# Patient Record
Sex: Female | Born: 1970 | Race: White | Hispanic: Yes | Marital: Single | Smoking: Never smoker
Health system: Southern US, Community
[De-identification: ages and names within clinical notes are randomized; demographics above are authoritative.]

## PROBLEM LIST (undated history)

## (undated) DIAGNOSIS — Z789 Other specified health status: Secondary | ICD-10-CM

## (undated) HISTORY — DX: Other specified health status: Z78.9

---

## 2015-02-21 HISTORY — PX: INGUINAL HERNIA REPAIR: SUR1180

## 2019-01-22 ENCOUNTER — Encounter: Payer: Self-pay | Admitting: Family Medicine

## 2019-01-22 ENCOUNTER — Ambulatory Visit (LOCAL_COMMUNITY_HEALTH_CENTER): Payer: Self-pay | Admitting: Family Medicine

## 2019-01-22 ENCOUNTER — Other Ambulatory Visit: Payer: Self-pay

## 2019-01-22 VITALS — BP 115/64 | Ht 60.0 in | Wt 126.0 lb

## 2019-01-22 DIAGNOSIS — Z3009 Encounter for other general counseling and advice on contraception: Secondary | ICD-10-CM

## 2019-01-22 DIAGNOSIS — Z30011 Encounter for initial prescription of contraceptive pills: Secondary | ICD-10-CM

## 2019-01-22 LAB — WET PREP FOR TRICH, YEAST, CLUE
Trichomonas Exam: NEGATIVE
Yeast Exam: NEGATIVE

## 2019-01-22 LAB — PREGNANCY, URINE: Preg Test, Ur: NEGATIVE

## 2019-01-22 MED ORDER — NORETHINDRONE 0.35 MG PO TABS: 1.0000 | ORAL_TABLET | Freq: Every day | ORAL | 0 refills | Status: DC

## 2019-01-22 NOTE — Progress Notes (Signed)
UPT is negative today. Wet mount reviewed with provider and no treatment needed per standing order and per [name] Done, FNP verbal order. Pt received Norethindrone (Micronor) #1 pack today per provider order. Counseled pt per provider orders and pt states understanding. IUD consult completed and pt scheduled for Paragard IUD insertion for 02/04/2019 at 10:00am per pt request. Pt aware of appt date and time and to arrive early for check-in, and appt card given to pt. Pt aware to either not have sex or use condoms every time from now until coming in for appt. Provider orders completed.Ronny Bacon, RN

## 2019-01-22 NOTE — Progress Notes (Addendum)
Family Planning Visit- Repeat Yearly Visit  Subjective:  Madison James is a 48 y.o. being seen today for an well woman visit and to discuss family planning options.    She is currently using none for pregnancy prevention. Patient reports she does not  or her partner wants a pregnancy in the next year. Patient  does not have a problem list on file.  Chief Complaint  Patient presents with  . Contraception    pap/std screen and discuss IUD    Patient reports she was seen in [place] 2 year ago for her pap (Normal per client) and exam.  Client has monthly menses and states that her LMP was 01/02/2019.  She has had unprotected sex x 3 since menses.  She denies chronic health concerns, she's non-smoker and denies STD sympts.  Does the patient desire a pregnancy in the next year? (OKQ flowsheet)no  See flowsheet for other program required questions.   Body mass index is 24.61 kg/m. - Patient is eligible for diabetes screening based on BMI and age >20?  not applicable HA1C ordered? not applicable  Patient reports 1 of partners in last year. Desires STI screening?  No  Does the patient have a current or past history of drug use? No   No components found for: HCV]   Health Maintenance Due  Topic Date Due  . HIV Screening  08/04/1984  . TETANUS/TDAP  08/04/1988  . PAP SMEAR-Modifier  08/05/1990  . INFLUENZA VACCINE  09/21/2018    ROS  The following portions of the patient's history were reviewed and updated as appropriate: allergies, current medications, past family history, past medical history, past social history, past surgical history and problem list. Problem list updated.  Objective:   Vitals:   01/22/19 1032  BP: 115/64  Weight: 126 lb (57.2 kg)  Height: 5' (1.524 m)    Physical Exam Constitutional:      Appearance: She is normal weight.  Chest:     Breasts: Breasts are symmetrical.        Right: Normal.        Left: Normal.  Abdominal:     General:  There is no distension.     Palpations: Abdomen is soft. There is no mass.     Tenderness: There is no abdominal tenderness.     Hernia: There is no hernia in the left inguinal area or right inguinal area.  Genitourinary:    General: Normal vulva.     Pubic Area: No rash or pubic lice.      Labia:        Right: No rash, tenderness or lesion.        Left: No rash, tenderness or lesion.      Urethra: No prolapse or urethral swelling.     Vagina: Normal. No vaginal discharge.     Cervix: Normal.     Uterus: Normal.      Adnexa: Right adnexa normal and left adnexa normal.     Rectum: Normal.  Musculoskeletal:     Cervical back: Neck supple. No tenderness.  Lymphadenopathy:     Cervical: No cervical adenopathy.     Upper Body:     Right upper body: No supraclavicular, axillary or pectoral adenopathy.     Left upper body: No supraclavicular, axillary or pectoral adenopathy.     Lower Body: No right inguinal adenopathy. No left inguinal adenopathy.  Skin:    General: Skin is warm and dry.  Findings: No lesion or rash.  Neurological:     Mental Status: She is alert.  Psychiatric:        Mood and Affect: Mood normal.       Assessment and Plan:  Madison James is a 48 y.o. female presenting to the [facility] Department for an initial well woman exam/family planning visit  Contraception counseling: Reviewed all forms of birth control options in the tiered based approach. available including abstinence; over the counter/barrier methods; hormonal contraceptive medication including pill, patch, ring, injection,contraceptive implant; hormonal and nonhormonal IUDs; permanent sterilization options including vasectomy and the various tubal sterilization modalities. Risks, benefits, and typical effectiveness rates were reviewed.  Questions were answered.  Written information was also given to the patient to review.  Patient desires IUD for birth control. Micronor x 1 pack  was prescribed for patient. She will follow up in  2 weeks for PT and IUD insertion.  She was told to call with any further questions, or with any concerns about this method of contraception.  Emphasized use of condoms 100% of the time for STI prevention.  ECP was not offered to the patient due to 3 times of unprotected sex since last menses (11/12//2020)- most recent 01/19/2019  Patient desires Paragard IUD.  She will start Micronor until IUD  this was prescribed for patient. She will follow up in  2 wks for surveillance.  Patient was counseled on the side effect of the method chosen, the correct use of her desired method and how to discontinue in the future. She was told to call with any further questions, or with any concerns about this method of contraception.  Emphasized use of condoms 100% of the time for back-up and STI prevention.  1. General counseling and advice on contraceptive management  - IGP, Aptima HPV - Chlamydia/Gonorrhea [facility] - HIV [place] [facility] - Syphilis Serology, [place] [facility] - WET PREP FOR TRICH, YEAST, clues/amine Treat wet prep as per SO. -PT if neg may begin POPs- PT negative Schedule for Paragard IUD insertion week of 02/03/2019.  Needs PT prior to insertion  2. OCP (oral contraceptive pills) initiation Micronor take 1 po daily x 1 pack. Co to use condoms for back-up x 2 weeks until has IUD inserted.   No follow-ups on file.  No future appointments.  [name] Done, FNP

## 2019-01-22 NOTE — Progress Notes (Signed)
Patient in clinic today for pap and to discuss IUD. Would like all std screening and blood work. Reports had IUD with no hormones in [place] and liked it. Paragard info given.Aileen Fass, RN

## 2019-01-28 LAB — IGP, APTIMA HPV
HPV Aptima: NEGATIVE
PAP Smear Comment: 0

## 2019-02-04 ENCOUNTER — Other Ambulatory Visit: Payer: Self-pay

## 2019-02-04 ENCOUNTER — Ambulatory Visit (LOCAL_COMMUNITY_HEALTH_CENTER): Payer: Self-pay | Admitting: Family Medicine

## 2019-02-04 VITALS — BP 109/71 | Ht 60.0 in | Wt 125.2 lb

## 2019-02-04 DIAGNOSIS — Z3009 Encounter for other general counseling and advice on contraception: Secondary | ICD-10-CM

## 2019-02-04 DIAGNOSIS — Z30430 Encounter for insertion of intrauterine contraceptive device: Secondary | ICD-10-CM

## 2019-02-04 DIAGNOSIS — Z3200 Encounter for pregnancy test, result unknown: Secondary | ICD-10-CM

## 2019-02-04 LAB — PREGNANCY, URINE: Preg Test, Ur: NEGATIVE

## 2019-02-04 MED ORDER — PARAGARD INTRAUTERINE COPPER IU IUD: INTRAUTERINE_SYSTEM | Freq: Once | INTRAUTERINE | Status: AC

## 2019-02-04 NOTE — Progress Notes (Signed)
    Patient presented to [facility] for IUD insertion. Her GC/CT screening from 01/23/2019 were not received and using WHO criteria we can be reasonably certain she is not pregnant or a pregnancy test was obtained which was Urine pregnancy test negative.  Client has been taking Micronor since 01/22/2019.  See Flowsheet for IUD check list  IUD Insertion Procedure Note Patient identified, informed consent performed, consent signed.   Discussed risks of irregular bleeding, cramping, infection, malpositioning or misplacement of the IUD outside the uterus which may require further procedure such as laparoscopy. Time out was performed.   Speculum placed in the vagina.  Cervix visualized.  Cleaned with Betadine x 2.  Grasped anteriorly with a single tooth tenaculum.  Uterus sounded to 8 cm.  IUD placed per manufacturer's recommendations.  Strings trimmed to 3 cm.  Tenaculum was removed, good hemostasis noted.  Patient tolerated procedure well.   Patient was given post-procedure instructions- both agency handout and verbally by provider.  She was advised to have backup contraception for one week.  Patient was also asked to check IUD strings periodically or follow up in 4 weeks for IUD check.

## 2019-02-04 NOTE — Progress Notes (Signed)
No physical at [facility]. Negative UPT. Pt to clinic for Paragard IUD insertion.

## 2019-08-05 ENCOUNTER — Encounter: Payer: Self-pay | Admitting: Physician Assistant

## 2019-08-05 NOTE — Progress Notes (Signed)
Patient had cotest pap in 01/2019, and was reviewed by me.  Results note not showing in chart, but patient to have repeat pap in 5 years (cotest), 01/2024.

## 2019-12-15 ENCOUNTER — Ambulatory Visit (LOCAL_COMMUNITY_HEALTH_CENTER): Payer: Self-pay | Admitting: Advanced Practice Midwife

## 2019-12-15 ENCOUNTER — Other Ambulatory Visit: Payer: Self-pay

## 2019-12-15 ENCOUNTER — Ambulatory Visit: Payer: Self-pay

## 2019-12-15 VITALS — BP 111/69 | Temp 99.0°F | Ht 60.0 in | Wt 125.4 lb

## 2019-12-15 DIAGNOSIS — Z3009 Encounter for other general counseling and advice on contraception: Secondary | ICD-10-CM

## 2019-12-15 NOTE — Progress Notes (Signed)
Presents to [facility] for evaluation of back pain and headache. Adult Health Services info sheet given. Counseled physical is due 01/2020. Denies problems with IUD. Jossie Ng, RN

## 2019-12-15 NOTE — Progress Notes (Signed)
   [facility] problem [facility]  [facility]  Subjective:  Madison James is a 49 y.o. SHF G4P3 (30, 28, 15) nonsmoker being seen today for c/o left shoulder blade/back pain x 3 wks and constant H/A "always" on left side.  Was lifting heavy boxes at work and thinks that what caused the pain initially so had to quit working several months ago.  Last Tylenol yesterday.  Upon further questioning, her 70 yo son died 2019-08-23 of OD and she just got his belongings a few days ago.  Pt grieving and crying and declines counseling x2.  PE due 01/2020.  Happy with IUD inserted 02/04/19.Marland Kitchen  Last pap 01/2019.  Can't remember last sex.  LMP 11/04/19.  Chief Complaint  Patient presents with  . back pain, HA    HPI   Does the patient have a current or past history of drug use? No   No components found for: HCV]   Health Maintenance Due  Topic Date Due  . Hepatitis C Screening  Never done  . HIV Screening  Never done  . TETANUS/TDAP  Never done  . MAMMOGRAM  Never done  . COLONOSCOPY  Never done  . INFLUENZA VACCINE  Never done    ROS  The following portions of the patient's history were reviewed and updated as appropriate: allergies, current medications, past family history, past medical history, past social history, past surgical history and problem list. Problem list updated.   See flowsheet for other program required questions.  Objective:   Vitals:   12/15/19 1335  BP: 111/69  Temp: 99 F (37.2 C)  TempSrc: Oral  Weight: 125 lb 6.4 oz (56.9 kg)  Height: 5' (1.524 m)    Physical Exam Vitals and nursing note reviewed.  Constitutional:      Appearance: Normal appearance. She is normal weight.  HENT:     Head: Normocephalic.  Eyes:     Conjunctiva/sclera: Conjunctivae normal.  Pulmonary:     Effort: Pulmonary effort is normal.  Musculoskeletal:        General: Normal range of motion.  Skin:    General: Skin is warm and dry.   Neurological:     Mental Status: She is alert. Mental status is at baseline.   palpation of back reveals tight muscles in left side of neck, shoulder, back tender to palpation    Assessment and Plan:  Madison James is a 49 y.o. female presenting to the [facility] Department for a Women's Health problem visit  1. Family planning Please give primary care MD list to pt Contact info given for Kathreen Cosier, LCSW Pt counseled to make apt with a primary care MD; suggestions given for warm moist heat to area, massage, Crista Elliot or [employer]     No follow-ups on file.  No future appointments.  Alberteen Spindle, CNM

## 2020-03-19 ENCOUNTER — Other Ambulatory Visit: Payer: Self-pay

## 2020-08-03 ENCOUNTER — Other Ambulatory Visit: Payer: Self-pay

## 2020-08-03 ENCOUNTER — Ambulatory Visit (LOCAL_COMMUNITY_HEALTH_CENTER): Payer: Self-pay | Admitting: Family Medicine

## 2020-08-03 ENCOUNTER — Encounter: Payer: Self-pay | Admitting: Family Medicine

## 2020-08-03 VITALS — BP 109/69 | HR 78 | Temp 98.6°F | Resp 18 | Ht 60.0 in | Wt 130.2 lb

## 2020-08-03 DIAGNOSIS — Z3009 Encounter for other general counseling and advice on contraception: Secondary | ICD-10-CM

## 2020-08-03 DIAGNOSIS — E010 Iodine-deficiency related diffuse (endemic) goiter: Secondary | ICD-10-CM

## 2020-08-03 DIAGNOSIS — R1032 Left lower quadrant pain: Secondary | ICD-10-CM

## 2020-08-03 DIAGNOSIS — Z113 Encounter for screening for infections with a predominantly sexual mode of transmission: Secondary | ICD-10-CM

## 2020-08-03 DIAGNOSIS — R1031 Right lower quadrant pain: Secondary | ICD-10-CM

## 2020-08-03 DIAGNOSIS — Z0000 Encounter for general adult medical examination without abnormal findings: Secondary | ICD-10-CM

## 2020-08-03 DIAGNOSIS — N644 Mastodynia: Secondary | ICD-10-CM

## 2020-08-03 LAB — WET PREP FOR TRICH, YEAST, CLUE
Trichomonas Exam: NEGATIVE
Yeast Exam: NEGATIVE

## 2020-08-03 LAB — PREGNANCY, URINE: Preg Test, Ur: NEGATIVE

## 2020-08-03 NOTE — Progress Notes (Signed)
Family Planning Visit- Repeat Yearly Visit  Subjective:  Madison James is a 50 y.o. [phone]  being seen today for an annual wellness visit and to discuss contraception options.   The patient is currently using IUD Paragard for pregnancy prevention. Patient does not want a pregnancy in the next year. Patient has the following medical problems: does not have a problem list on file.  Chief Complaint  Patient presents with   Annual Exam    Patient reports here for physical  Patient denies need for birth control   See flowsheet for other program required questions.   Body mass index is 25.43 kg/m. - Patient is eligible for diabetes screening based on BMI and age >79?  no HA1C ordered? no  Patient reports 1 of partners in last year. Desires STI screening?  Yes   Has patient been screened once for HCV in the past?  No  No results found for: HCVAB  Does the patient have current of drug use, have a partner with drug use, and/or has been incarcerated since last result? No  If yes-- Screen for HCV through [facility]   Does the patient meet criteria for HBV testing? No  Criteria:  -Household, sexual or needle sharing contact with HBV -History of drug use -HIV positive -Those with known Hep C   Health Maintenance Due  Topic Date Due   HIV Screening  Never done   Hepatitis C Screening  Never done   TETANUS/TDAP  Never done   COLONOSCOPY (Pts 45-19yrs Insurance coverage will need to be confirmed)  Never done   MAMMOGRAM  Never done   Zoster Vaccines- Shingrix (1 of 2) Never done    Review of Systems  Constitutional:  Negative for chills, fever, malaise/fatigue and weight loss.  HENT:  Negative for congestion, hearing loss and sore throat.   Eyes:  Negative for blurred vision, double vision and photophobia.  Respiratory:  Negative for shortness of breath.   Cardiovascular:  Negative for chest pain.  Gastrointestinal:  Negative for abdominal pain, blood in stool,  constipation, diarrhea, heartburn, nausea and vomiting.  Genitourinary:  Negative for dysuria and frequency.  Musculoskeletal:  Negative for back pain, joint pain and neck pain.  Skin:  Negative for itching and rash.  Neurological:  Positive for headaches. Negative for dizziness and weakness.  Endo/Heme/Allergies:  Does not bruise/bleed easily.  Psychiatric/Behavioral:  Negative for depression, substance abuse and suicidal ideas.    The following portions of the patient's history were reviewed and updated as appropriate: allergies, current medications, past family history, past medical history, past social history, past surgical history and problem list. Problem list updated.  Objective:   Vitals:   08/03/20 0938  BP: 109/69  Pulse: 78  Resp: 18  Temp: 98.6 F (37 C)  TempSrc: Oral  Weight: 130 lb 3.2 oz (59.1 kg)  Height: 5' (1.524 m)    Physical Exam Vitals and nursing note reviewed. Exam conducted with a chaperone present.  Constitutional:      Appearance: Normal appearance.  HENT:     Head: Normocephalic and atraumatic.     Mouth/Throat:     Mouth: Mucous membranes are moist.     Dentition: Normal dentition. No dental caries.     Pharynx: No oropharyngeal exudate or posterior oropharyngeal erythema.  Eyes:     General: No scleral icterus. Neck:     Thyroid: Thyromegaly present.  Cardiovascular:     Rate and Rhythm: Normal rate.  Pulses: Normal pulses.     Heart sounds: Normal heart sounds.  Pulmonary:     Effort: Pulmonary effort is normal.     Breath sounds: Normal breath sounds.  Chest:  Breasts:    Right: Mass and tenderness present. No swelling, bleeding, inverted nipple, nipple discharge or skin change.     Left: Mass and tenderness present. No swelling, bleeding, inverted nipple, nipple discharge or skin change.    Abdominal:     General: Abdomen is flat. Bowel sounds are normal.     Palpations: There is mass.     Tenderness: There is abdominal  tenderness in the left lower quadrant.  Genitourinary:    General: Normal vulva.     Rectum: Normal.     Comments: External genitalia without, lice, nits, erythema, edema , lesions or inguinal adenopathy. Vagina with normal mucosa and white discharge and pH equals 4.  Cervix without visual lesions, uterus firm, mobile, non-tender, no masses, CMT adnexal fullness or tenderness. IUD strings visualized  Musculoskeletal:        General: Normal range of motion.     Cervical back: Normal range of motion and neck supple.  Lymphadenopathy:     Cervical: No cervical adenopathy.  Skin:    General: Skin is warm and dry.  Neurological:     General: No focal deficit present.     Mental Status: She is alert and oriented to person, place, and time.  Psychiatric:        Mood and Affect: Mood normal.        Behavior: Behavior normal.      Assessment and Plan:  Madison James is a 50 y.o. female [phone] presenting to the [facility] Department for an yearly wellness and contraception visit   1. Routine general medical examination at a health care facility Well woman visit today - discussed perimenopause   Last pap was 02/09/2019- not due today Clinic Breast Exam   2. Family planning Contraception counseling: Reviewed all forms of birth control options in the tiered based approach. available including abstinence; over the counter/barrier methods; hormonal contraceptive medication including pill, patch, ring, injection,contraceptive implant, ECP; hormonal and nonhormonal IUDs; permanent sterilization options including vasectomy and the various tubal sterilization modalities. Risks, benefits, and typical effectiveness rates were reviewed.  Questions were answered.  Written information was also given to the patient to review.  Patient desires to keep Paraguard. She will follow up as needed for surveillance.  She was told to call with any further questions, or with any concerns about this  method of contraception.  Emphasized use of condoms 100% of the time for STI prevention.  Patient was not offered ECP based on current BCM.  - Pregnancy, urine   3. Screening examination for venereal disease Denies s/sx  - Chlamydia/Gonorrhea [place] [facility] - HIV [place] [facility] - Syphilis Serology, [facility] - WET PREP FOR TRICH, YEAST, CLUE  4. Bilateral lower abdominal pain Report x 2 weeks- reports that poop is hard small and pebble like  Discussed constipation and methods to resolve and prevent constipation., such as increasing water, prunes/ juice, miralax.   Information sheet given,  [facility] if not resolved  5. Pain of both breasts Reports sore breast for past 2 weeks Mass in both breast - R  mass is 3x1 cm @ 9 o'clock  L- @ 2 o'clock and is in most of outer quad.   Patient  groaned in pain with palpation.   Referral sent for BCCCP for follow up and mammogram   6. Thyromegaly Ref[facility] for patient follow up and labs.      Juliene Pina used for Spanish interpretation.     No follow-ups on file.  No future appointments.  Wendi Snipes, FNP

## 2020-08-03 NOTE — Progress Notes (Signed)
UPT = negative. Reviewed.  Wet prep = WNL. Reviewed Reviewed with provider, M.Garner, FNP, during clinic visit.   Floy Sabina, RN

## 2020-08-04 ENCOUNTER — Telehealth: Payer: Self-pay | Admitting: Gerontology

## 2020-08-04 NOTE — Telephone Encounter (Signed)
Pt was provided with information regarding the clinic and how to become a pt. They have stated they will try and come by the clinic this afternoon. If they do not come by today, they will try and be in the clinic next week.

## 2020-08-25 ENCOUNTER — Ambulatory Visit: Payer: Self-pay

## 2020-09-01 ENCOUNTER — Other Ambulatory Visit: Payer: Self-pay

## 2020-09-01 ENCOUNTER — Ambulatory Visit: Payer: Self-pay | Attending: Oncology

## 2020-09-01 ENCOUNTER — Ambulatory Visit
Admission: RE | Admit: 2020-09-01 | Discharge: 2020-09-01 | Disposition: A | Discharge status: Home / Self Care | Payer: Self-pay | Source: Ambulatory Visit | Attending: Oncology | Admitting: Oncology

## 2020-09-01 VITALS — BP 117/64 | HR 80 | Temp 97.4°F | Ht 60.0 in | Wt 128.7 lb

## 2020-09-01 DIAGNOSIS — N63 Unspecified lump in unspecified breast: Secondary | ICD-10-CM

## 2020-09-01 NOTE — Progress Notes (Signed)
  Subjective:     Patient ID: Madison James, female   DOB: [birth year], 50 y.o.   MRN: [id number]  HPI  BCCCP Medical History Record - 09/01/20 1256       Breast History   Screening cycle New    [facility] Date 08/04/20    [facility]    Initial Mammogram 09/01/20    Last Mammogram See comment   2014 in [place]   Recent Breast Symptoms Lump      Breast Cancer History   Breast Cancer History No personal or family history      Previous History of Breast Problems   Breast Surgery or Biopsy None    Breast Implants N/A    BSE Done Never      Gynecological/Obstetrical History   LMP 08/19/20    Is there any chance that the client could be pregnant?  No    Age at menarche 22    Age at menopause NA    PAP smear history Annually    Date of last PAP  01/22/19    Provider (PAP) Dr. Kizzie Ide    Age at first live birth 51    Breast fed children Yes (type length in comments)   a couple of months   DES Exposure Unkown    Cervical, Uterine or Ovarian cancer No    Family history of Cervial, Uterine or Ovarian cancer No    Hysterectomy No    Cervix removed No    Ovaries removed No    Laser/Cryosurgery No    Current method of birth control --   IUD   Current method of Estrogen/Hormone replacement None    Smoking history None              Review of Systems     Objective:   Physical Exam     Assessment:     50 year old Hispanic patient referred for bilateral breast masses.  Primary provider palpated a 3 cm breast mass right breast 9 o'clock and left breast pain and mass at 2 o'clock.  Patient screened, and meets BCCCP eligibility.  Patient does not have insurance, Medicare or Medicaid.  Instructed patient on breast self awareness using teach back method. Patient denies any breast pain or mass at today's visit.   Palpated a nodule left breast 9 oclock 2 cm from areola.  [name] from [facility] interpreted exam. Risk Assessment     Risk Scores       09/01/2020   Last  edited by: [name], RN   5-year risk: 0.5 %   Lifetime risk: 4.5 %               Plan:     Sent for bilateral diagnostic mammogram, and ultrasound.

## 2020-09-02 ENCOUNTER — Other Ambulatory Visit: Payer: Self-pay

## 2020-09-02 DIAGNOSIS — N63 Unspecified lump in unspecified breast: Secondary | ICD-10-CM

## 2020-09-02 NOTE — Progress Notes (Signed)
Radiologist reviewed Birads 3 mammogram results with patient.  Scheduled to return in six months for follow-up right breast mammogram.  Mailed appointment information to patient.

## 2021-03-03 ENCOUNTER — Other Ambulatory Visit: Payer: Self-pay

## 2021-03-07 ENCOUNTER — Ambulatory Visit
Admission: RE | Admit: 2021-03-07 | Discharge: 2021-03-07 | Disposition: A | Discharge status: Home / Self Care | Payer: Self-pay | Source: Ambulatory Visit | Attending: Oncology | Admitting: Oncology

## 2021-03-07 ENCOUNTER — Other Ambulatory Visit: Payer: Self-pay

## 2021-03-07 DIAGNOSIS — N63 Unspecified lump in unspecified breast: Secondary | ICD-10-CM | POA: Insufficient documentation

## 2021-03-16 ENCOUNTER — Encounter: Payer: Self-pay | Admitting: *Deleted

## 2021-03-16 NOTE — Progress Notes (Signed)
Letter mailed to inform patient of the need to return in six months for her next mammogram and annual BCCCP appointment.  Number given to schedule BCCCP appointment if she is still uninsured at that time.  Will transition care to Fonnie Mu, RN with BCCCP.

## 2021-09-20 ENCOUNTER — Other Ambulatory Visit: Payer: Self-pay

## 2021-09-20 DIAGNOSIS — N6489 Other specified disorders of breast: Secondary | ICD-10-CM

## 2021-09-21 ENCOUNTER — Ambulatory Visit: Payer: Self-pay

## 2021-09-23 ENCOUNTER — Other Ambulatory Visit: Payer: Self-pay

## 2021-09-23 ENCOUNTER — Ambulatory Visit: Payer: Self-pay | Attending: Hematology and Oncology | Admitting: *Deleted

## 2021-09-23 VITALS — BP 133/75 | Wt 133.7 lb

## 2021-09-23 DIAGNOSIS — N644 Mastodynia: Secondary | ICD-10-CM

## 2021-09-23 DIAGNOSIS — Z1211 Encounter for screening for malignant neoplasm of colon: Secondary | ICD-10-CM

## 2021-09-23 DIAGNOSIS — Z1239 Encounter for other screening for malignant neoplasm of breast: Secondary | ICD-10-CM

## 2021-09-23 NOTE — Patient Instructions (Signed)
Explained breast self awareness with Renee Harder Turcios. Patient did not need a Pap smear today due to last Pap smear and HPV typing was 01/22/2019. Let her know BCCCP will cover Pap smears and HPV typing every 5 years unless has a history of abnormal Pap smears. Referred patient to the [facility] for a diagnostic mammogram per recommendation. Appointment scheduled Wednesday, September 28, 2021 at 1400. Patient aware of appointment and will be there. CRISTEL RAIL Turcios verbalized understanding.  Jersi Mcmaster, Kathaleen Maser, RN 9:55 AM

## 2021-09-23 NOTE — Progress Notes (Signed)
[name] is a 51 y.o. female who presents to [facility] clinic today with complaint of left lower breast pain since July 2022 that comes and goes. Patient rates the pain at at 4 out of 10. Patient had a right breast diagnostic mammogram completed 03/07/2021 that a bilateral diagnostic mammogram is recommended for follow up.   Pap Smear: Pap smear not completed today. Last Pap smear was 01/22/2019 at the [facility] clinic and was normal with negative HPV. Per patient has no history of an abnormal Pap smear. Last Pap smear result is available in Epic.   Physical exam: Breasts Breasts symmetrical. No skin abnormalities bilateral breasts. No nipple retraction bilateral breasts. No nipple discharge bilateral breasts. No lymphadenopathy. No lumps palpated bilateral breasts. Complaints of left lower breast tenderness on exam.      MS DIGITAL DIAG TOMO UNI RIGHT  Result Date: 03/07/2021 CLINICAL DATA:  Six-month follow-up of a probable benign asymmetry in the posterolateral aspect of the right breast with no sonographic correlate. EXAM: DIGITAL DIAGNOSTIC UNILATERAL RIGHT MAMMOGRAM WITH TOMOSYNTHESIS AND CAD TECHNIQUE: Right digital diagnostic mammography and breast tomosynthesis was performed. The images were evaluated with computer-aided detection. COMPARISON:  Previous exam(s). ACR Breast Density Category c: The breast tissue is heterogeneously dense, which may obscure small masses. FINDINGS: Asymmetry in the posterolateral aspect of the right breast only seen on the cc view is stable. No suspicious mass or malignant type microcalcifications identified. IMPRESSION: Stable probable benign asymmetry in the right breast. RECOMMENDATION: Bilateral diagnostic mammogram in July of 2023 is recommended. I have discussed the findings and recommendations with the patient. If applicable, a reminder letter will be sent to the patient regarding the next appointment. BI-RADS CATEGORY  3:  Probably benign. Electronically Signed   By: Baird Lyons M.D.   On: 03/07/2021 10:42  MS DIGITAL DIAG TOMO BILAT  Result Date: 09/01/2020 CLINICAL DATA:  51 year old female who originally presented to a physician in June for evaluation of bilateral tender palpable lumps. The doctor at that time noted a tender mass at 9 o'clock measuring 3 x 1 cm and another mass had 2 o'clock involving much of the upper-outer quadrant of the left breast which was firm, hot and painful to the touch. The patient was referred to [facility], and now describes a palpable left breast mass at 9 o'clock, 2 cm from the areola. EXAM: DIGITAL DIAGNOSTIC BILATERAL MAMMOGRAM WITH TOMOSYNTHESIS AND CAD; ULTRASOUND LEFT BREAST LIMITED; ULTRASOUND RIGHT BREAST LIMITED TECHNIQUE: Bilateral digital diagnostic mammography and breast tomosynthesis was performed. The images were evaluated with computer-aided detection.; Targeted ultrasound examination of the left breast was performed; Targeted ultrasound examination of the right breast was performed COMPARISON:  None. ACR Breast Density Category c: The breast tissue is heterogeneously dense, which may obscure small masses. FINDINGS: There is an asymmetry in the lateral right breast seen only on the CC view which spans approximately 2.8 cm. A BB has been placed on the lower inner quadrant of the left breast at the palpable site of concern. No suspicious findings are identified deep to this palpable marker on the spot compression tomosynthesis images. No suspicious calcifications, masses or areas of distortion are seen in the bilateral breasts. Physical exam of the lateral right breast, lateral left breast and lower inner left breast demonstrates no discrete suspicious palpable masses. Ultrasound targeted to the lateral aspect of the right breast demonstrates normal dense fibroglandular tissue. No suspicious masses or areas of shadowing are identified. Ultrasound targeted to  the palpable area in the  upper-outer quadrant of the left breast demonstrates normal fibroglandular tissue. No suspicious masses or areas of shadowing are identified. Ultrasound targeted to the patient's identified palpable site in the lower-inner quadrant of the left breast demonstrates normal fibroglandular tissue. No suspicious masses or areas of shadowing are identified. IMPRESSION: 1. There is a likely benign asymmetry in the posterolateral right breast on the CC view. 2. There are no suspicious mammographic or targeted sonographic abnormalities to correspond with the palpable areas of concern in the bilateral breasts. 3.  No mammographic evidence of malignancy in the bilateral breasts. RECOMMENDATION: Six-month follow-up diagnostic right breast mammogram. I have discussed the findings and recommendations with the patient. If applicable, a reminder letter will be sent to the patient regarding the next appointment. BI-RADS CATEGORY  3: Probably benign. Electronically Signed   By: Frederico Hamman M.D.   On: 09/01/2020 17:11   Pelvic/Bimanual Pap is not indicated today per BCCCP guidelines.   Smoking History: Patient has never smoked.   Patient Navigation: Patient education provided. Access to services provided for patient through [employer] program. Spanish interpreter [name] from [facility] provided.   Colorectal Cancer Screening: Per patient has never had colonoscopy completed. FIT Test given to patient to complete. No complaints today.    Breast and Cervical Cancer Risk Assessment: Patient does not have family history of breast cancer, known genetic mutations, or radiation treatment to the chest before age 91. Patient does not have history of cervical dysplasia, immunocompromised, or DES exposure in-utero.  Risk Assessment     Risk Scores       09/23/2021 09/01/2020   Last edited by: Narda Rutherford, LPN [name], RN   5-year risk: 0.5 % 0.5 %   Lifetime risk: 4.4 % 4.5 %             A: BCCCP exam without pap smear Complaint of right breast pain.  P: Referred patient to the [facility] for a diagnostic mammogram per recommendation. Appointment scheduled Wednesday, September 28, 2021 at 1400.  Priscille Heidelberg, RN 09/23/2021 9:55 AM

## 2021-09-28 ENCOUNTER — Ambulatory Visit
Admission: RE | Admit: 2021-09-28 | Discharge: 2021-09-28 | Disposition: A | Discharge status: Home / Self Care | Payer: Self-pay | Source: Ambulatory Visit | Attending: Obstetrics and Gynecology | Admitting: Obstetrics and Gynecology

## 2021-09-28 DIAGNOSIS — N6489 Other specified disorders of breast: Secondary | ICD-10-CM

## 2021-10-26 LAB — FECAL OCCULT BLOOD, IMMUNOCHEMICAL

## 2021-11-02 ENCOUNTER — Telehealth: Payer: Self-pay

## 2021-11-02 NOTE — Telephone Encounter (Signed)
10/31/2021- Via Madison James, [facility]), Patient informed due to delay transport, specimen for FIT test could not be processed, we can send another FIT test kit if prefers. Patient requested new kit to be mailed to her. FIT test kit mailed.

## 2021-11-03 ENCOUNTER — Ambulatory Visit (LOCAL_COMMUNITY_HEALTH_CENTER): Payer: Self-pay | Admitting: Advanced Practice Midwife

## 2021-11-03 ENCOUNTER — Ambulatory Visit: Payer: Self-pay

## 2021-11-03 ENCOUNTER — Encounter: Payer: Self-pay | Admitting: Advanced Practice Midwife

## 2021-11-03 VITALS — BP 114/71 | Ht 60.0 in | Wt 131.6 lb

## 2021-11-03 DIAGNOSIS — Z3009 Encounter for other general counseling and advice on contraception: Secondary | ICD-10-CM

## 2021-11-03 DIAGNOSIS — Z30431 Encounter for routine checking of intrauterine contraceptive device: Secondary | ICD-10-CM

## 2021-11-03 NOTE — Progress Notes (Signed)
[facility] [address] Main Number: [phone]    Family Planning Visit- Initial Visit  Subjective:  Madison James is a 51 y.o. SHF nonsmoker  [id number] (32, 30, 16)  being seen today for an initial annual visit and to discuss reproductive life planning.  The patient is currently using IUD or IUS for pregnancy prevention. Patient reports   does not want a pregnancy in the next year.     report they are looking for a method that provides High efficacy at preventing pregnancy  Patient has the following medical conditions does not have a problem list on file.  Chief Complaint  Patient presents with   Annual Exam    PE and IUD check    Patient reports here for physical and IUD check. Last PE 07/2020. Paraguard inserted 02/04/2019. Last pap 01/22/2019 neg HPV neg. LMP 08/13/21.  Last sex 10/31/21 without condom; with current partner x 3 years; 1 partner in last 3 mo. Last dental exam never. First mammogram 10/01/21. Not working. Living with her 82 yo daughter. C/o "my bones hurt all the time" x 6 mo relieved with Tylenol.   Patient denies cigs, vaping, cigars, MJ  Body mass index is 25.7 kg/m. - Patient is eligible for diabetes screening based on BMI and age >60?  yes HA1C ordered? yes  Patient reports 1  partner/s in last year. Desires STI screening?  Yes  Has patient been screened once for HCV in the past?  No  No results found for: "HCVAB"  Does the patient have current drug use (including MJ), have a partner with drug use, and/or has been incarcerated since last result? No  If yes-- Screen for HCV through [facility]   Does the patient meet criteria for HBV testing? No  Criteria:  -Household, sexual or needle sharing contact with HBV -History of drug use -HIV positive -Those with known Hep C   Health Maintenance Due  Topic Date Due   COVID-19 Vaccine (1) Never done   HIV Screening  Never done    Hepatitis C Screening  Never done   TETANUS/TDAP  Never done   COLONOSCOPY (Pts 45-28yrs Insurance coverage will need to be confirmed)  Never done   Zoster Vaccines- Shingrix (1 of 2) Never done   INFLUENZA VACCINE  Never done    Review of Systems  Eyes:  Positive for blurred vision (has never been to eye doctor; encouraged eye exam).  Genitourinary:  Positive for frequency (drinks 1 c. coffee/day, tea, occassional soda; counseled to stop, x 4 mo with symptoms. referred to primary care MD).  Neurological:  Positive for headaches (q 3 days always occipital relieved with Tylenol).  All other systems reviewed and are negative.   The following portions of the patient's history were reviewed and updated as appropriate: allergies, current medications, past family history, past medical history, past social history, past surgical history and problem list. Problem list updated.   See flowsheet for other program required questions.  Objective:   Vitals:   11/03/21 1542  BP: 114/71  Weight: 131 lb 9.6 oz (59.7 kg)  Height: 5' (1.524 m)    Physical Exam Constitutional:      Appearance: Normal appearance. She is normal weight.  HENT:     Head: Normocephalic and atraumatic.     Mouth/Throat:     Mouth: Mucous membranes are moist.     Comments: Dentition fair; never had dental exam Eyes:  Conjunctiva/sclera: Conjunctivae normal.  Neck:     Thyroid: No thyroid mass, thyromegaly or thyroid tenderness.  Cardiovascular:     Rate and Rhythm: Normal rate and regular rhythm.  Pulmonary:     Effort: Pulmonary effort is normal.     Breath sounds: Normal breath sounds.  Chest:  Breasts:    Right: Normal.     Left: Normal.  Abdominal:     Palpations: Abdomen is soft.     Comments: Soft without masses or tenderness, fgood tone  Genitourinary:    General: Normal vulva.     Exam position: Lithotomy position.     Vagina: Vaginal discharge (white creamy leukorrhea, ph<4.5) present.      Cervix: Normal.     Uterus: Normal.      Adnexa: Right adnexa normal and left adnexa normal.     Rectum: Normal.     Comments: Paraguard strings visible Musculoskeletal:        General: Normal range of motion.     Cervical back: Normal range of motion and neck supple.  Skin:    General: Skin is warm and dry.  Neurological:     Mental Status: She is alert.  Psychiatric:        Mood and Affect: Mood normal.       Assessment and Plan:  Madison James is a 51 y.o. female presenting to the [facility] for an initial annual wellness/contraceptive visit  Contraception counseling: Reviewed options based on patient desire and reproductive life plan. Patient is interested in IUD or IUS. This was not provided to the patient today. Already has Paraguard in place. if not why not clearly documented  Risks, benefits, and typical effectiveness rates were reviewed.  Questions were answered.  Written information was also given to the patient to review.    The patient will follow up in  1 years for surveillance.  The patient was told to call with any further questions, or with any concerns about this method of contraception.  Emphasized use of condoms 100% of the time for STI prevention.  Need for ECP was assessed. Patient reported not meeting criteria.  Reviewed options and patient desired Cu-IUD   1. Family planning Treat wet mount per standing orders Please give pt dental list Pe[place] give pt primary care MD list Immunization nurse consult   - WET PREP FOR TRICH, YEAST, CLUE - Hemoglobin, venipuncture - Chlamydia/Gonorrhea [facility] - Hgb A1c w/o eAG - HIV [place] [facility] - Syphilis Serology, [place] [facility]  2. Encounter for routine checking of intrauterine contraceptive device (IUD) IUD strings visible     No follow-ups on file.  No future appointments.  Alberteen Spindle, CNM

## 2021-11-03 NOTE — Progress Notes (Signed)
Pt here for PE and IUD check.  Wet mount results reviewed, no treatment required per Provider.  Pt notified of normal Hgb results.  Berdie Ogren, RN

## 2021-11-04 LAB — WET PREP FOR TRICH, YEAST, CLUE
Trichomonas Exam: NEGATIVE
Yeast Exam: NEGATIVE

## 2021-11-04 LAB — HGB A1C W/O EAG: Hgb A1c MFr Bld: 5.8 % — ABNORMAL HIGH (ref 4.8–5.6)

## 2021-11-04 LAB — HEMOGLOBIN, FINGERSTICK: Hemoglobin: 14 g/dL (ref 11.1–15.9)

## 2021-11-14 ENCOUNTER — Telehealth: Payer: Self-pay

## 2021-11-14 NOTE — Telephone Encounter (Signed)
-----   Message from Eduard Clos, RN sent at 11/04/2021 10:05 AM EDT -----  ----- Message ----- From: Interface, [facility] Lab Results In Sent: 11/04/2021   5:36 AM EDT To: Achd-Results

## 2021-11-14 NOTE — Telephone Encounter (Signed)
Pt notified of Hgb A1c results.  Pt states that she does not have a PCP, however, she will make an appointment with someone from the PCP list that was given to her in our office.

## 2021-12-15 ENCOUNTER — Other Ambulatory Visit: Payer: Self-pay

## 2021-12-15 DIAGNOSIS — Z1211 Encounter for screening for malignant neoplasm of colon: Secondary | ICD-10-CM

## 2022-06-29 ENCOUNTER — Ambulatory Visit: Payer: Self-pay | Admitting: Family Medicine

## 2022-06-29 ENCOUNTER — Encounter: Payer: Self-pay | Admitting: Family Medicine

## 2022-06-29 VITALS — BP 123/75 | HR 81 | Ht 60.0 in | Wt 130.6 lb

## 2022-06-29 DIAGNOSIS — N760 Acute vaginitis: Secondary | ICD-10-CM

## 2022-06-29 DIAGNOSIS — Z113 Encounter for screening for infections with a predominantly sexual mode of transmission: Secondary | ICD-10-CM

## 2022-06-29 DIAGNOSIS — B9689 Other specified bacterial agents as the cause of diseases classified elsewhere: Secondary | ICD-10-CM

## 2022-06-29 LAB — WET PREP FOR TRICH, YEAST, CLUE
Trichomonas Exam: NEGATIVE
Yeast Exam: NEGATIVE

## 2022-06-29 MED ORDER — METRONIDAZOLE 500 MG PO TABS: 500.0000 mg | ORAL_TABLET | Freq: Two times a day (BID) | ORAL | 0 refills | Status: AC

## 2022-06-29 NOTE — Progress Notes (Signed)
[facility] Problem Visit  [facility]  Subjective:  Annaliz Savary is a 52 y.o. being seen today for   Chief Complaint  Patient presents with   Acute Visit    Here for IUD removal-    HPI  Pt reports no period the last 3 months, she states that 6 months ago, she began having intermittent discharge and a vaginal odor that she didn't like. She attributes this to the IUD. I reviewed with her that this was unlikely, because she has had the IUD for 4 years, and I expressed that it is possible her body is going through hormonal changes and likely perimenopausal. I explained that we can remove the IUD if she wants, however if she continues to have periods- though unlikely, she could still possibly get pregnant.    Health Maintenance Due  Topic Date Due   COVID-19 Vaccine (1) Never done   HIV Screening  Never done   Hepatitis C Screening  Never done   DTaP/Tdap/Td (1 - Tdap) Never done   COLONOSCOPY (Pts 45-62yrs Insurance coverage will need to be confirmed)  Never done   Zoster Vaccines- Shingrix (1 of 2) Never done    Review of Systems  Constitutional:  Negative for weight loss.  Eyes:  Negative for blurred vision.  Respiratory:  Negative for cough and shortness of breath.   Cardiovascular:  Negative for claudication.  Gastrointestinal:  Negative for nausea.  Genitourinary:  Negative for dysuria and frequency.  Skin:  Negative for rash.  Neurological:  Negative for headaches.  Endo/Heme/Allergies:  Does not bruise/bleed easily.    The following portions of the patient's history were reviewed and updated as appropriate: allergies, current medications, past family history, past medical history, past social history, past surgical history and problem list. Problem list updated.   See flowsheet for other program required questions.  Objective:   Vitals:   06/29/22 0935  BP: 123/75  Pulse: 81  Weight: 130 lb 9.6 oz (59.2 kg)   Height: 5' (1.524 m)    Physical Exam Vitals and nursing note reviewed.  Constitutional:      Appearance: Normal appearance.  HENT:     Head: Normocephalic and atraumatic.     Mouth/Throat:     Mouth: Mucous membranes are moist.     Pharynx: Oropharynx is clear. No oropharyngeal exudate or posterior oropharyngeal erythema.  Pulmonary:     Effort: Pulmonary effort is normal.  Abdominal:     General: Abdomen is flat.     Palpations: There is no mass.     Tenderness: There is no abdominal tenderness. There is no rebound.  Genitourinary:    General: Normal vulva.     Exam position: Lithotomy position.     Pubic Area: No rash or pubic lice.      Tanner stage (genital): 5.     Labia:        Right: No rash or lesion.        Left: No rash or lesion.      Vagina: Normal. No vaginal discharge, erythema, bleeding or lesions.     Cervix: Lesion present. No cervical motion tenderness, discharge, friability or erythema.     Uterus: Normal.      Adnexa: Right adnexa normal and left adnexa normal.     Rectum: Normal.        Comments: pH = 5  Redness on cervix- possible eversion -white paraguard strings visualized  Mod amt of  thin yellow discharge visualized  Lymphadenopathy:     Head:     Right side of head: No preauricular or posterior auricular adenopathy.     Left side of head: No preauricular or posterior auricular adenopathy.     Cervical: No cervical adenopathy.     Upper Body:     Right upper body: No supraclavicular, axillary or epitrochlear adenopathy.     Left upper body: No supraclavicular, axillary or epitrochlear adenopathy.     Lower Body: No right inguinal adenopathy. No left inguinal adenopathy.  Skin:    General: Skin is warm and dry.     Findings: No rash.  Neurological:     Mental Status: She is alert and oriented to person, place, and time.       Assessment and Plan:  Wretha Lieb is a 52 y.o. female presenting to the [facility]  [facility] for a Women's Health problem visit  1. Screening for venereal disease Initially presented for IUD removal d/t 6 months of intermittent discharge from vagina -Wet mount today showed BV- treated today Pt decided to keep IUD  - Chlamydia/Gonorrhea [place] [facility] - WET PREP FOR TRICH, YEAST, CLUE  2. Bacterial vaginosis  - metroNIDAZOLE (FLAGYL) 500 MG tablet; Take 1 tablet (500 mg total) by mouth 2 (two) times daily for 7 days.  Dispense: 14 tablet; Refill: 0    Return in about 1 year (around 06/29/2023) for annual well-woman exam.  No future appointments.  Due to language barrier, interpreter [employer] [phone]) was present for this visit.  Lenice Llamas, [place]

## 2022-06-29 NOTE — Progress Notes (Signed)
Pt here for removal of IUD.  No issues with IUD other than concern it's causing current discharge/vaginal odor.  Wet mount done.  Results positive for bacterial vaginosis.  Metronidazole 500mg  #14 1 po BID x 7 days dispensed to patient.  Counseled on medication, side effects, plan of care and when to contact clinic for questions or concerns.  Pt verbalizes understanding of above.  Per provider consult, pt advised that her IUD was not the cause of her current bacterial infection but she would remove the IUD if patient desired.  Pt to continue with IUD for now.  BV pamphlet in Spanish given to pt.  Condoms declined.-Collins Scotland, RN

## 2022-07-02 IMAGING — MG MM DIGITAL DIAGNOSTIC UNILAT*R* W/ TOMO W/ CAD
4 series · 4 of 12 positions shown · non-contrast
Comparison: Previous exam(s).

CLINICAL DATA: Six-month follow-up of a probable benign asymmetry
in the posterolateral aspect of the right breast with no sonographic
correlate.

EXAM:
DIGITAL DIAGNOSTIC UNILATERAL RIGHT MAMMOGRAM WITH TOMOSYNTHESIS AND
CAD
TECHNIQUE: Right digital diagnostic mammography and breast tomosynthesis was
performed. The images were evaluated with computer-aided detection.

[R CC synth-2D]
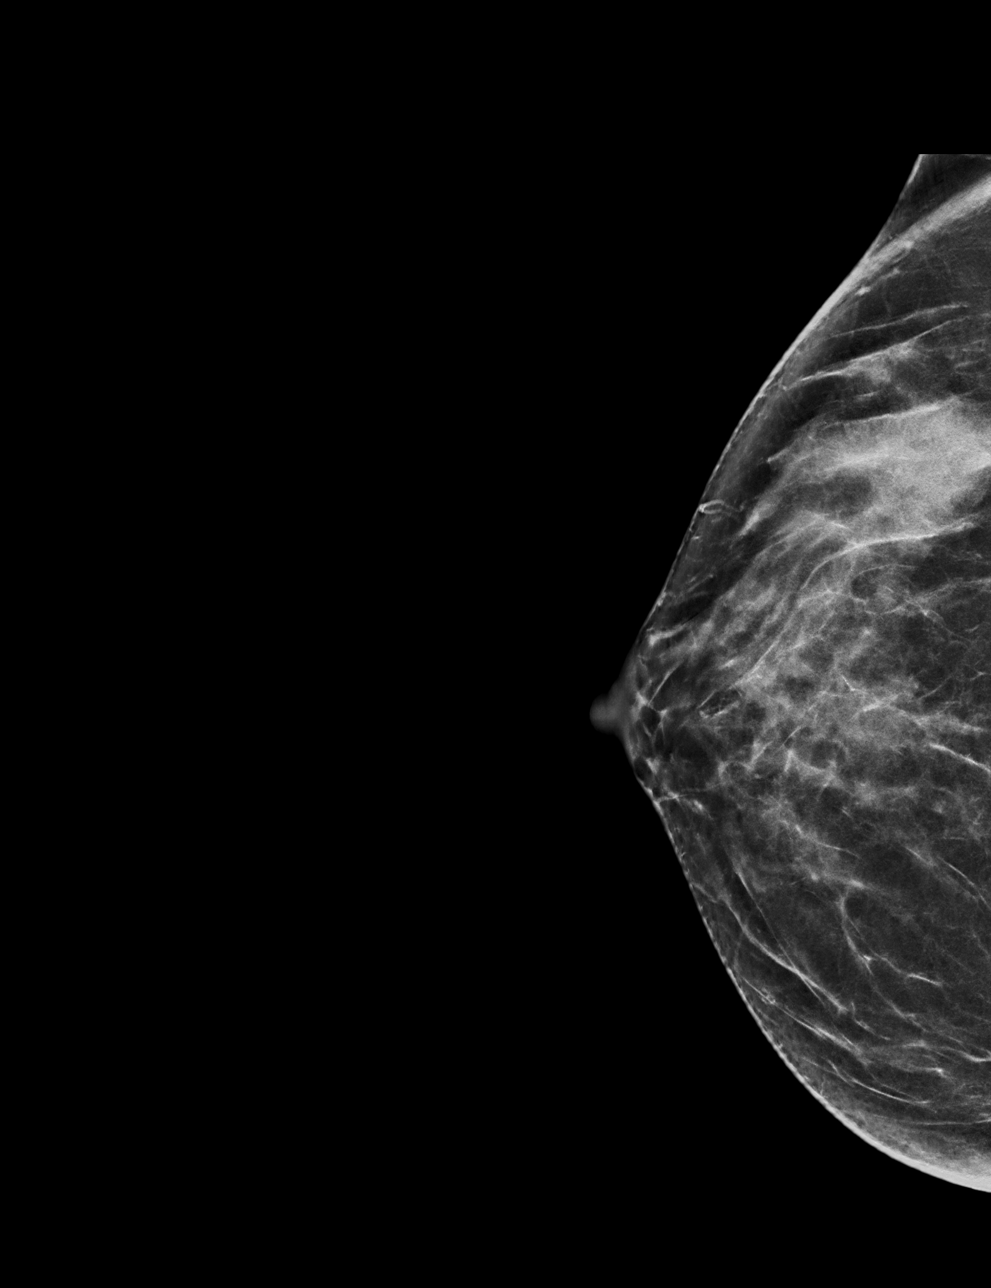

[R MLO synth-2D]
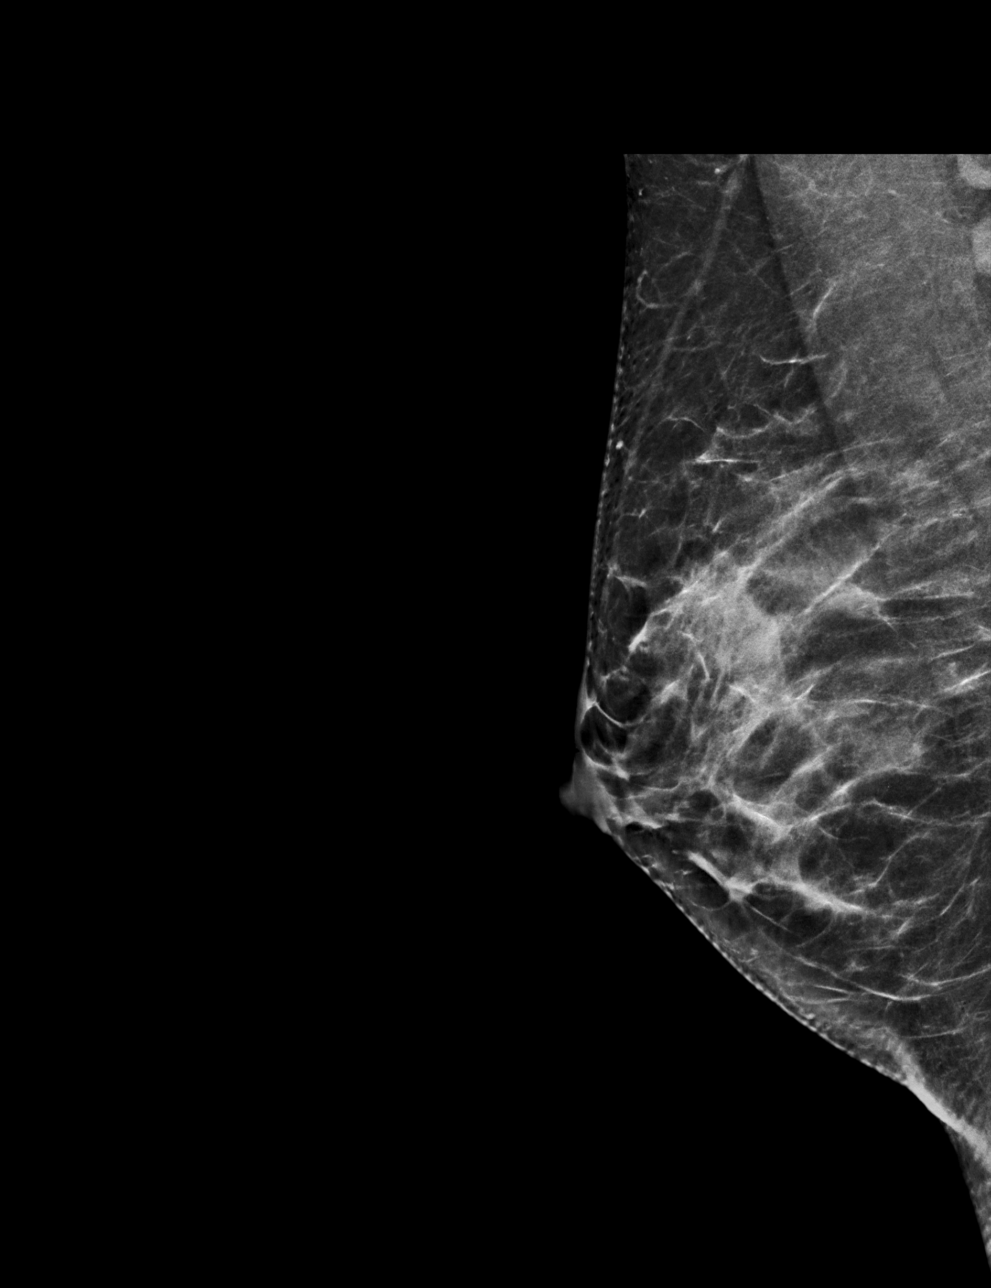

[R MLO tomo · tomo slice 33/65.0]
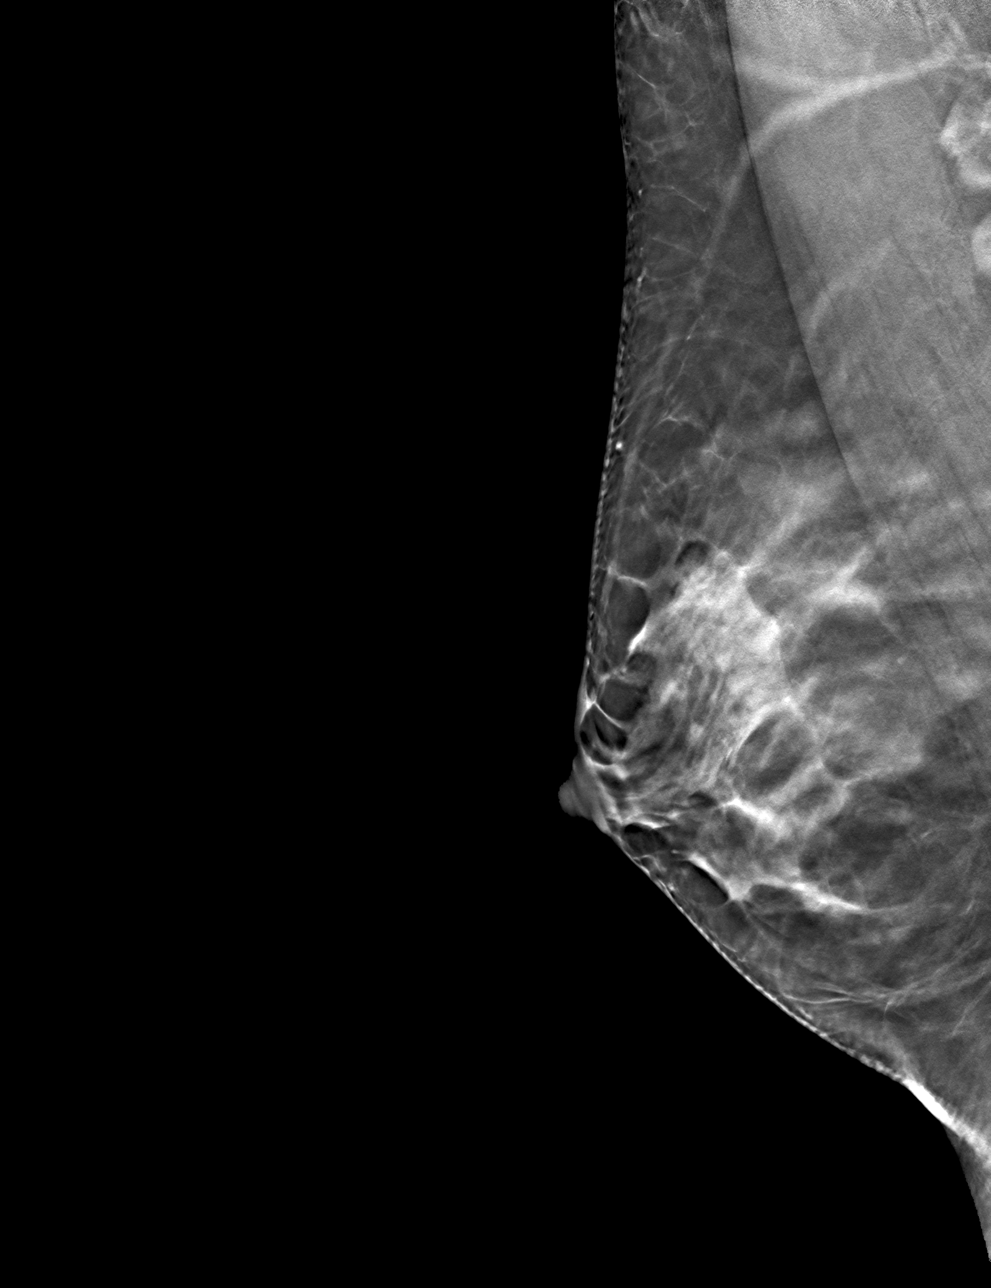

[R CC tomo · tomo slice 30/59.0]
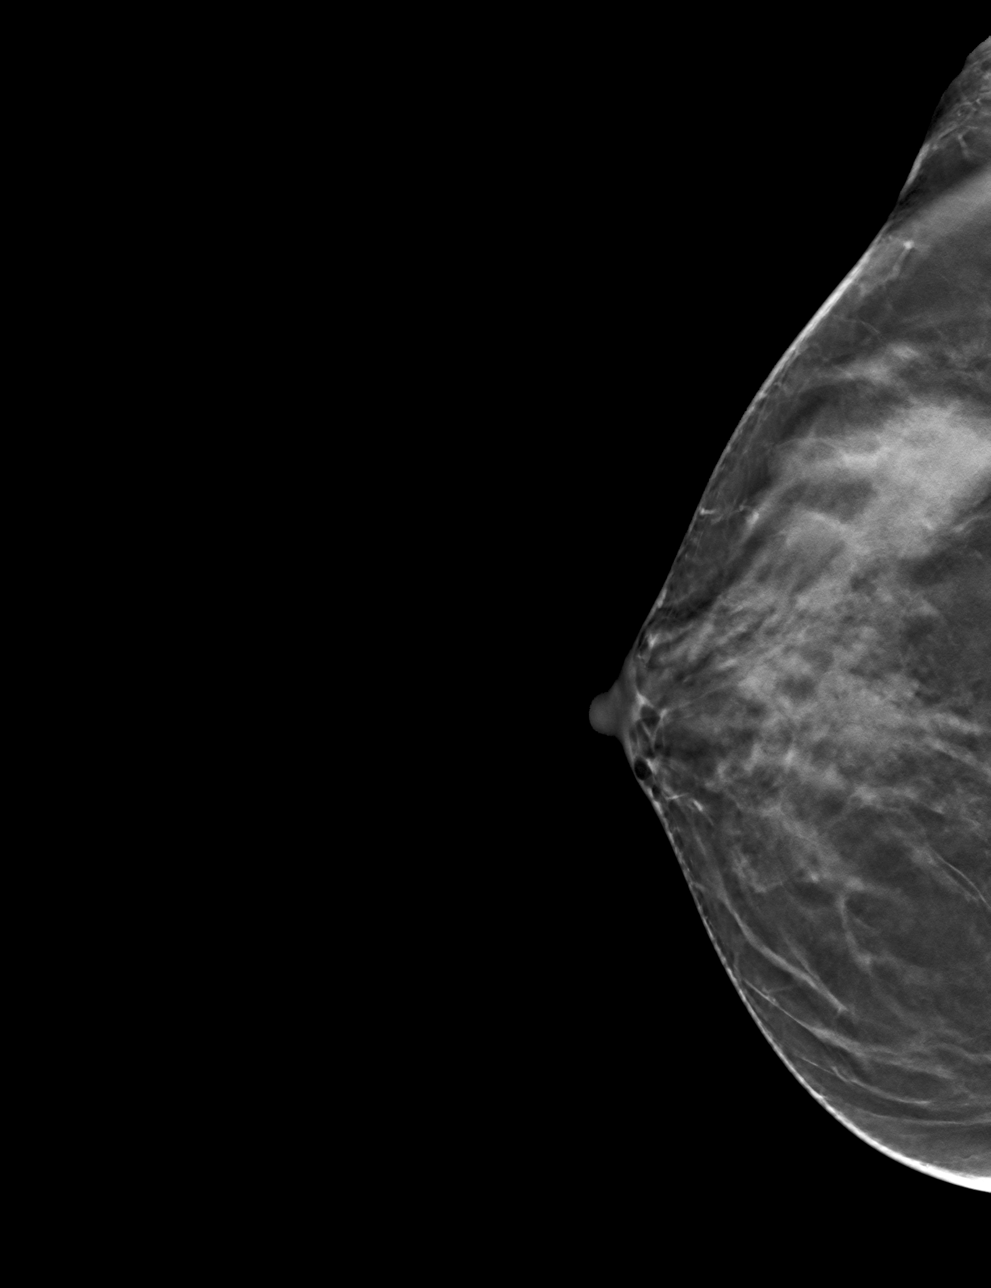

[4 of 12 positions shown; findings below may reference images not displayed]

ACR Breast Density Category c: The breast tissue is heterogeneously
dense, which may obscure small masses.
FINDINGS: Asymmetry in the posterolateral aspect of the right breast only seen
on the cc view is stable. No suspicious mass or malignant type
microcalcifications identified.
IMPRESSION: Stable probable benign asymmetry in the right breast.

RECOMMENDATION:
Bilateral diagnostic mammogram in Friday August, 2021 is recommended.

I have discussed the findings and recommendations with the patient.
If applicable, a reminder letter will be sent to the patient
regarding the next appointment.

BI-RADS CATEGORY  3: Probably benign.

## 2022-09-04 ENCOUNTER — Other Ambulatory Visit: Payer: Self-pay

## 2022-09-04 DIAGNOSIS — N6489 Other specified disorders of breast: Secondary | ICD-10-CM

## 2022-10-02 ENCOUNTER — Ambulatory Visit: Payer: Self-pay

## 2022-11-06 ENCOUNTER — Ambulatory Visit
Admission: RE | Admit: 2022-11-06 | Discharge: 2022-11-06 | Disposition: A | Discharge status: Home / Self Care | Payer: Self-pay | Source: Ambulatory Visit | Attending: Obstetrics and Gynecology | Admitting: Obstetrics and Gynecology

## 2022-11-06 ENCOUNTER — Ambulatory Visit: Payer: Self-pay | Attending: Hematology and Oncology | Admitting: Hematology and Oncology

## 2022-11-06 VITALS — BP 117/63 | Wt 132.0 lb

## 2022-11-06 DIAGNOSIS — N6489 Other specified disorders of breast: Secondary | ICD-10-CM

## 2022-11-06 NOTE — Progress Notes (Signed)
[name] is a 52 y.o. female who presents to [facility] clinic today with no complaints.    Pap Smear: Pap not smear completed today. Last Pap smear was 01/22/19 at [facility] clinic and was normal. Per patient has no history of an abnormal Pap smear. Last Pap smear result is available in Epic.   Physical exam: Breasts Breasts symmetrical. No skin abnormalities bilateral breasts. No nipple retraction bilateral breasts. No nipple discharge bilateral breasts. No lymphadenopathy. No lumps palpated bilateral breasts.  MS DIGITAL DIAG TOMO BILAT  Result Date: 09/28/2021 CLINICAL DATA:  BI-RADS 3 follow-up of a RIGHT breast asymmetry, initiated July 2022 EXAM: DIGITAL DIAGNOSTIC BILATERAL MAMMOGRAM WITH TOMOSYNTHESIS TECHNIQUE: Bilateral digital diagnostic mammography and breast tomosynthesis was performed. COMPARISON:  Previous exam(s). ACR Breast Density Category c: The breast tissue is heterogeneously dense, which may obscure small masses. FINDINGS: Diagnostic images of the RIGHT breast demonstrate mammographic stability of an asymmetry seen in the RIGHT CC view only in the outer breast. No new suspicious findings are noted in the RIGHT breast. No suspicious mass, distortion, or microcalcifications are identified to suggest presence of malignancy in the LEFT breast. IMPRESSION: 1. Stable probably benign RIGHT breast asymmetry which likely reflects patient's baseline configuration of fibroglandular tissue. Recommend follow-up diagnostic mammogram in 1 year. This will establish 2 years of definitive stability of from baseline mammogram. 2. No mammographic evidence of malignancy in the LEFT breast. RECOMMENDATION: Bilateral diagnostic mammogram (with RIGHT/LEFT breast ultrasound as deemed necessary) in 1 year I have discussed the findings and recommendations with the patient in Spanish with the assistance a Spanish interpreter. If applicable, a reminder letter will be sent to the patient regarding the next  appointment. BI-RADS CATEGORY  3: Probably benign. Electronically Signed   By: Meda Klinefelter M.D.   On: 09/28/2021 14:31  MS DIGITAL DIAG TOMO UNI RIGHT  Result Date: 03/07/2021 CLINICAL DATA:  Six-month follow-up of a probable benign asymmetry in the posterolateral aspect of the right breast with no sonographic correlate. EXAM: DIGITAL DIAGNOSTIC UNILATERAL RIGHT MAMMOGRAM WITH TOMOSYNTHESIS AND CAD TECHNIQUE: Right digital diagnostic mammography and breast tomosynthesis was performed. The images were evaluated with computer-aided detection. COMPARISON:  Previous exam(s). ACR Breast Density Category c: The breast tissue is heterogeneously dense, which may obscure small masses. FINDINGS: Asymmetry in the posterolateral aspect of the right breast only seen on the cc view is stable. No suspicious mass or malignant type microcalcifications identified. IMPRESSION: Stable probable benign asymmetry in the right breast. RECOMMENDATION: Bilateral diagnostic mammogram in July of 2023 is recommended. I have discussed the findings and recommendations with the patient. If applicable, a reminder letter will be sent to the patient regarding the next appointment. BI-RADS CATEGORY  3: Probably benign. Electronically Signed   By: Baird Lyons M.D.   On: 03/07/2021 10:42  MS DIGITAL DIAG TOMO BILAT  Result Date: 09/01/2020 CLINICAL DATA:  52 year old female who originally presented to a physician in June for evaluation of bilateral tender palpable lumps. The doctor at that time noted a tender mass at 9 o'clock measuring 3 x 1 cm and another mass had 2 o'clock involving much of the upper-outer quadrant of the left breast which was firm, hot and painful to the touch. The patient was referred to [facility], and now describes a palpable left breast mass at 9 o'clock, 2 cm from the areola. EXAM: DIGITAL DIAGNOSTIC BILATERAL MAMMOGRAM WITH TOMOSYNTHESIS AND CAD; ULTRASOUND LEFT BREAST LIMITED; ULTRASOUND RIGHT BREAST LIMITED  TECHNIQUE: Bilateral digital diagnostic mammography  and breast tomosynthesis was performed. The images were evaluated with computer-aided detection.; Targeted ultrasound examination of the left breast was performed; Targeted ultrasound examination of the right breast was performed COMPARISON:  None. ACR Breast Density Category c: The breast tissue is heterogeneously dense, which may obscure small masses. FINDINGS: There is an asymmetry in the lateral right breast seen only on the CC view which spans approximately 2.8 cm. A BB has been placed on the lower inner quadrant of the left breast at the palpable site of concern. No suspicious findings are identified deep to this palpable marker on the spot compression tomosynthesis images. No suspicious calcifications, masses or areas of distortion are seen in the bilateral breasts. Physical exam of the lateral right breast, lateral left breast and lower inner left breast demonstrates no discrete suspicious palpable masses. Ultrasound targeted to the lateral aspect of the right breast demonstrates normal dense fibroglandular tissue. No suspicious masses or areas of shadowing are identified. Ultrasound targeted to the palpable area in the upper-outer quadrant of the left breast demonstrates normal fibroglandular tissue. No suspicious masses or areas of shadowing are identified. Ultrasound targeted to the patient's identified palpable site in the lower-inner quadrant of the left breast demonstrates normal fibroglandular tissue. No suspicious masses or areas of shadowing are identified. IMPRESSION: 1. There is a likely benign asymmetry in the posterolateral right breast on the CC view. 2. There are no suspicious mammographic or targeted sonographic abnormalities to correspond with the palpable areas of concern in the bilateral breasts. 3.  No mammographic evidence of malignancy in the bilateral breasts. RECOMMENDATION: Six-month follow-up diagnostic right breast mammogram. I  have discussed the findings and recommendations with the patient. If applicable, a reminder letter will be sent to the patient regarding the next appointment. BI-RADS CATEGORY  3: Probably benign. Electronically Signed   By: Frederico Hamman M.D.   On: 09/01/2020 17:11        Pelvic/Bimanual Pap is not indicated today    Smoking History: Patient has never smoked and was not referred to quit line.    Patient Navigation: Patient education provided. Access to services provided for patient through [facility] program. Madison James interpreter provided. No transportation provided   Colorectal Cancer Screening: Per patient has never had colonoscopy completed No complaints today. Declined FIT test.   Breast and Cervical Cancer Risk Assessment: Patient does not have family history of breast cancer, known genetic mutations, or radiation treatment to the chest before age 74. Patient does not have history of cervical dysplasia, immunocompromised, or DES exposure in-utero.  Risk Scores as of Encounter on 11/06/2022     Madison James           5-year 0.46%   Lifetime 3.98%            Last calculated by Narda Rutherford, LPN on 0/86/5784 at  9:52 AM          A: [facility] exam without pap smear No complaints with benign exam. Follow up stable probably benign right breast asymmetry.   P: Referred patient to the [facility] for a diagnostic mammogram. Appointment scheduled 11/06/22.  Pascal Lux, NP 11/06/2022 9:23 AM

## 2022-11-06 NOTE — Patient Instructions (Addendum)
Taught SHALAINE FRYMOYER Turcios about self breast awareness and gave educational materials to take home. Patient did not need a Pap smear today due to last Pap smear was in 01/22/19 per patient. Let her know [facility] will cover Pap smears every 5 years unless has a history of abnormal Pap smears. Referred patient to the [facility] for diagnostic mammogram. Appointment scheduled for 11/06/22. Patient aware of appointment and will be there. Let patient know will follow up with her within the next couple weeks with results. JAZMAN RUNYAN Turcios verbalized understanding.  Pascal Lux, NP 9:24 AM

## 2023-09-07 ENCOUNTER — Encounter: Payer: Self-pay | Admitting: Nurse Practitioner

## 2023-09-07 ENCOUNTER — Ambulatory Visit: Payer: Self-pay

## 2023-09-07 ENCOUNTER — Ambulatory Visit: Payer: Self-pay | Admitting: Nurse Practitioner

## 2023-09-07 VITALS — BP 118/72 | HR 71 | Temp 97.3°F | Wt 121.4 lb

## 2023-09-07 DIAGNOSIS — N841 Polyp of cervix uteri: Secondary | ICD-10-CM | POA: Insufficient documentation

## 2023-09-07 DIAGNOSIS — Z3009 Encounter for other general counseling and advice on contraception: Secondary | ICD-10-CM

## 2023-09-07 DIAGNOSIS — Z113 Encounter for screening for infections with a predominantly sexual mode of transmission: Secondary | ICD-10-CM

## 2023-09-07 DIAGNOSIS — Z01419 Encounter for gynecological examination (general) (routine) without abnormal findings: Secondary | ICD-10-CM

## 2023-09-07 DIAGNOSIS — Z30432 Encounter for removal of intrauterine contraceptive device: Secondary | ICD-10-CM

## 2023-09-07 LAB — HM HIV SCREENING LAB: HM HIV Screening: NEGATIVE

## 2023-09-07 LAB — WET PREP FOR TRICH, YEAST, CLUE
Clue Cell Exam: NEGATIVE
Trichomonas Exam: NEGATIVE
Yeast Exam: NEGATIVE

## 2023-09-07 NOTE — Progress Notes (Signed)
 [employer] [facility] [address] [place] [address] Main phone: [phone]  Family Planning Visit - Repeat Yearly Visit  Subjective:  Madison James is a 53 y.o. [phone]  being seen today for an annual wellness visit and to discuss contraception options. The patient is currently using IUD (or IUS) for pregnancy prevention. Patient does not want a pregnancy in the next year.    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 She has not tried wearing glasses or going to an optometrist. She also reports being diagnosed with GERD in [place]. She is not taking anything for this.    See flowsheet for further details and programmatic requirements Hyperlink available at the top of the signed note in blue.  Flow sheet content below:  Pregnancy Intention Screening Does the patient want to become pregnant in the next year?: No Does the patient's partner want to become pregnant in the next year?: No Would the patient like to discuss contraceptive options today?: No Results Follow up Is it okay to contact you by mail?: Yes Contraception History Past methods of contraception used by patient:: Intrauterine device or system (IDU,IUS) Sexual History How often do you have your period?: no period last 2 years (denies spotting) Date of last sex?: 12/05/22 Has the patient had unprotected sex within the last 5 days?: No Do you have sex with men, women, both men and women?: Men only In the past 2 months how many partners have you had sex with?: 0 In the past 12 months, how many partners have you had sex with?: 0 Is it possible that any of your sex partners in the past 12 months had sex with someone else whild they were still in a sexual  relationship with you?: No What ways do you have sex?: Vaginal Do you or your partner use condoms and/or dental dams every time you have vaginal, oral or anal sex?: No Do you douche?: No Have you ever had an STD?: No Have any of your partners had an STD?: No Have you or your partner ever shot up drugs?: No Have any of your partners used drugs in the past?: No Have you or your partners exchanged money or drugs for sex?: No Assess # of cigarettes per day: 0 Contraception Wrap Up Current Method: IUD or IUS Interpreter Interpreters used: Gladies Teixeria  Diabetes screening This patient is 53 y.o. with a BMI of There is no height or weight on file to calculate BMI..  Is patient eligible for diabetes screening (age >35 and BMI >25)?  yes  Was Hgb A1c ordered? yes  STI screening Patient reports 1 of partners in last year.  Does this patient desire STI screening?  Yes  Hepatitis C screening Has patient been screened once for HCV in the past?  No  No results found for: HCVAB  Does the patient meet criteria for HCV testing? No  (If yes-- Screen for HCV through [facility]) Criteria:  Since the last HCV result, does the patient have any of the following? - Current drug use - Have a partner with drug use - Has been incarcerated  Hepatitis B screening Does the patient meet criteria for HBV testing? No Criteria:  -Household, sexual or needle sharing contact with HBV -History of  drug use -HIV positive -Those with known Hep C  Cervical Cancer Screening  Result Date Procedure Results Follow-ups  01/22/2019 IGP, Aptima HPV DIAGNOSIS:: Comment Specimen adequacy:: Comment Clinician Provided ICD10: Comment Performed by:: Comment PAP Smear Comment: . Note:: Comment Test Methodology: Comment HPV Aptima: Negative     Health Maintenance Due  Topic Date Due   HIV Screening  Never done   Hepatitis C Screening  Never done   DTaP/Tdap/Td (1 - Tdap) Never done   Hepatitis B Vaccines  (1 of 3 - 19+ 3-dose series) Never done   Colonoscopy  Never done   Zoster Vaccines- Shingrix (1 of 2) Never done   COVID-19 Vaccine (1 - 2024-25 season) Never done    The following portions of the patient's history were reviewed and updated as appropriate: allergies, current medications, past family history, past medical history, past social history, past surgical history and problem list. Problem list updated.  Objective:   Vitals:   09/07/23 1002  BP: 118/72  Pulse: 71  Temp: (!) 97.3 F (36.3 C)    Physical Exam Vitals and nursing note reviewed. Exam conducted with a chaperone present Kemp).  Constitutional:      Appearance: Normal appearance.  HENT:     Head: Normocephalic and atraumatic.     Mouth/Throat:     Mouth: Mucous membranes are moist.     Pharynx: Oropharynx is clear. No oropharyngeal exudate or posterior oropharyngeal erythema.  Cardiovascular:     Heart sounds: Normal heart sounds.  Pulmonary:     Effort: Pulmonary effort is normal.     Breath sounds: Normal breath sounds.  Chest:  Breasts:    Right: Normal.     Left: Normal.     Comments: Scattered freckles and benign looking moles Abdominal:     General: Abdomen is flat.     Palpations: There is no mass.     Tenderness: There is no abdominal tenderness. There is no rebound.  Genitourinary:    General: Normal vulva.     Exam position: Lithotomy position.     Pubic Area: No rash or pubic lice.      Labia:        Right: No rash or lesion.        Left: No rash or lesion.      Vagina: Vaginal discharge (small amount of yellow discharge) present. No erythema, bleeding or lesions.     Cervix: No cervical motion tenderness, discharge, friability, lesion or erythema.     Rectum: Normal.        Comments: pH over 5 External signs of aging: mild vulvar atrophy and scattered vulvar melanoses  Cervical polyp visualized Pap and swabs collected Lymphadenopathy:     Head:     Right side of head: No  preauricular or posterior auricular adenopathy.     Left side of head: No preauricular or posterior auricular adenopathy.     Cervical: No cervical adenopathy.     Upper Body:     Right upper body: No supraclavicular, axillary or epitrochlear adenopathy.     Left upper body: No supraclavicular, axillary or epitrochlear adenopathy.     Lower Body: No right inguinal adenopathy. No left inguinal adenopathy.  Skin:    General: Skin is warm and dry.     Findings: No rash.  Neurological:     Mental Status: She is alert and oriented to person, place, and time.  Psychiatric:        Mood and Affect:  Mood normal.        Behavior: Behavior normal.     Assessment and Plan:  Madison James is a 53 y.o. female [phone] presenting to the [facility] for an yearly wellness and contraception visit   1. Family planning (Primary) Discussed that if pt has period before October she could possibly conceive. Pt reports she has no partner right now. Discussed when menopause is official. Recommended pt establish care with a PCP going forward.  2. Well woman exam CBE normal. Pt has yearly mammograms with [facility] Last pap 01/2019 NILM HPV-.  Pap collected today  - IGP, Aptima HPV - Hgb A1c w/o eAG  3. Encounter for IUD removal Procedure:  IUD Removal  Patient identified, informed consent performed, consent signed.  Patient was in the dorsal lithotomy position, normal external genitalia was noted.  A speculum was placed in the patient's vagina, normal discharge was noted, no lesions. The cervix was visualized.  The strings of the IUD were grasped and pulled using ring forceps. The IUD was removed in its entirety. Patient tolerated the procedure well.     4. Screening for venereal disease WP: negative  - Syphilis Serology, [place] [facility] - HIV [place] [facility] - Chlamydia/Gonorrhea [place] [facility] - WET PREP FOR TRICH, YEAST, CLUE  5. Cervical polyp Discussed finding with  pt. Explained that it could cause bleeding or dyspareunia and should be easy to remove in the outpt setting if she so desires in the future.  Due to language barrier, a Spanish interpreter Kemp T.) was present in person during the history-taking, subsequent discussion, and physical exam with this patient.    Return if symptoms worsen or fail to improve.  No future appointments.  Saphire Barnhart K [place], NP

## 2023-09-07 NOTE — Progress Notes (Signed)
 In house lab results reviewed during visit. Gery Kubas RN

## 2023-09-08 LAB — HGB A1C W/O EAG: Hgb A1c MFr Bld: 5.7 % — ABNORMAL HIGH (ref 4.8–5.6)

## 2023-09-11 LAB — IGP, APTIMA HPV
HPV Aptima: NEGATIVE
PAP Smear Comment: 0

## 2023-09-12 NOTE — Progress Notes (Signed)
 A1c elevated, remains in the pre-diabetic range. Will call to discuss.   Dorothyann Helling, MD 09/12/23  11:41 AM

## 2023-09-12 NOTE — Telephone Encounter (Signed)
 Due to language barrier, a Spanish interpreter Rosaline, [place] [phone]) was present by phone during the history-taking, subsequent discussion, and physical exam with this patient.   A1c elevated, remains in the pre-diabetic range. Now diagnosed with pre-diabetes.   No answer, message left via interpreter.  Dorothyann Helling, MD 09/12/23  11:45 AM

## 2023-09-17 ENCOUNTER — Encounter: Payer: Self-pay | Admitting: Nurse Practitioner

## 2023-10-23 ENCOUNTER — Telehealth: Payer: Self-pay | Admitting: Family Medicine

## 2023-10-28 ENCOUNTER — Other Ambulatory Visit: Payer: Self-pay | Admitting: Family Medicine

## 2023-10-28 NOTE — Progress Notes (Signed)
 PAP smear reviewed, result: NILM (normal), HPV negative. Based on this result and patient's prior cervical cancer screenings, [facility] currently recommends repeat PAP smear in 5 years with HPV co-testing.  Dorothyann Helling, MD 10/28/23  2:50 PM

## 2023-11-02 ENCOUNTER — Other Ambulatory Visit: Payer: Self-pay

## 2023-11-02 DIAGNOSIS — Z1231 Encounter for screening mammogram for malignant neoplasm of breast: Secondary | ICD-10-CM

## 2023-11-27 ENCOUNTER — Ambulatory Visit: Payer: Self-pay | Attending: Obstetrics and Gynecology | Admitting: *Deleted

## 2023-11-27 ENCOUNTER — Other Ambulatory Visit: Payer: Self-pay

## 2023-11-27 ENCOUNTER — Ambulatory Visit
Admission: RE | Admit: 2023-11-27 | Discharge: 2023-11-27 | Disposition: A | Discharge status: Home / Self Care | Payer: Self-pay | Source: Ambulatory Visit | Attending: Obstetrics and Gynecology | Admitting: Obstetrics and Gynecology

## 2023-11-27 VITALS — BP 116/79 | Ht 60.0 in | Wt 122.0 lb

## 2023-11-27 DIAGNOSIS — Z1239 Encounter for other screening for malignant neoplasm of breast: Secondary | ICD-10-CM

## 2023-11-27 DIAGNOSIS — Z1211 Encounter for screening for malignant neoplasm of colon: Secondary | ICD-10-CM

## 2023-11-27 DIAGNOSIS — Z1231 Encounter for screening mammogram for malignant neoplasm of breast: Secondary | ICD-10-CM | POA: Insufficient documentation

## 2023-11-27 NOTE — Patient Instructions (Signed)
 Explained breast self awareness with Vina Osie Friday Turcios. Patient did not need a Pap smear today due to last Pap smear and HPV typing was 09/07/2023. Let her know [facility] will cover Pap smears and HPV typing every 5 years unless has a history of abnormal Pap smears. Referred patient to the [facility] for a screening mammogram. Appointment scheduled Tuesday, November 27, 2023 at 1120. Patient aware of appointment and will be there. Let patient know Raymondo will follow up with her within the next couple weeks with results of her mammogram by letter or phone. Vina Osie Sierra Turcios verbalized understanding.  Hughes Wyndham, Wanda Ship, RN 10:58 AM

## 2023-11-27 NOTE — Progress Notes (Signed)
 [name] is a 53 y.o. female who presents to [facility] clinic today with no complaints.    Pap Smear: Pap smear not completed today. Last Pap smear was 09/07/2023 at the [facility] clinic and was normal with negative HPV. Per patient has no history of an abnormal Pap smear. Last Pap smear result is available in Epic.   Physical exam: Breasts Breasts symmetrical. No skin abnormalities bilateral breasts. No nipple retraction bilateral breasts. No nipple discharge bilateral breasts. No lymphadenopathy. No lumps palpated bilateral breasts. No complaints of pain or tenderness on exam.     MS 3D DIAG MAMMO BILAT BR (aka MM) Result Date: 11/06/2022 CLINICAL DATA:  BI-RADS 3 follow-up of a RIGHT breast asymmetry, initiated July 2022. EXAM: DIGITAL DIAGNOSTIC BILATERAL MAMMOGRAM WITH TOMOSYNTHESIS AND CAD TECHNIQUE: Bilateral digital diagnostic mammography and breast tomosynthesis was performed. The images were evaluated with computer-aided detection. COMPARISON:  Previous exam(s). ACR Breast Density Category c: The breasts are heterogeneously dense, which may obscure small masses. FINDINGS: Diagnostic images of the RIGHT breast demonstrate mammographic stability of an asymmetry in the RIGHT outer breast seen on CC view only. No suspicious mass, distortion, or microcalcifications are identified to suggest presence of malignancy. IMPRESSION: No mammographic evidence of malignancy bilaterally. Stable mammographic appearance of a RIGHT breast asymmetry for greater than 2 years, consistent with benign baseline configuration of fibroglandular tissue. RECOMMENDATION: Screening mammogram in one year.(Code:SM-B-01Y) I have discussed the findings and recommendations with the patient with the assistance of a Spanish interpreter. If applicable, a reminder letter will be sent to the patient regarding the next appointment. BI-RADS CATEGORY  2: Benign. Electronically Signed   By:  Corean Salter M.D.   On: 11/06/2022 11:12   MS DIGITAL DIAG TOMO BILAT Result Date: 09/28/2021 CLINICAL DATA:  BI-RADS 3 follow-up of a RIGHT breast asymmetry, initiated July 2022 EXAM: DIGITAL DIAGNOSTIC BILATERAL MAMMOGRAM WITH TOMOSYNTHESIS TECHNIQUE: Bilateral digital diagnostic mammography and breast tomosynthesis was performed. COMPARISON:  Previous exam(s). ACR Breast Density Category c: The breast tissue is heterogeneously dense, which may obscure small masses. FINDINGS: Diagnostic images of the RIGHT breast demonstrate mammographic stability of an asymmetry seen in the RIGHT CC view only in the outer breast. No new suspicious findings are noted in the RIGHT breast. No suspicious mass, distortion, or microcalcifications are identified to suggest presence of malignancy in the LEFT breast. IMPRESSION: 1. Stable probably benign RIGHT breast asymmetry which likely reflects patient's baseline configuration of fibroglandular tissue. Recommend follow-up diagnostic mammogram in 1 year. This will establish 2 years of definitive stability of from baseline mammogram. 2. No mammographic evidence of malignancy in the LEFT breast. RECOMMENDATION: Bilateral diagnostic mammogram (with RIGHT/LEFT breast ultrasound as deemed necessary) in 1 year I have discussed the findings and recommendations with the patient in Spanish with the assistance a Spanish interpreter. If applicable, a reminder letter will be sent to the patient regarding the next appointment. BI-RADS CATEGORY  3: Probably benign. Electronically Signed   By: Corean Salter M.D.   On: 09/28/2021 14:31  MS DIGITAL DIAG TOMO UNI RIGHT Result Date: 03/07/2021 CLINICAL DATA:  Six-month follow-up of a probable benign asymmetry in the posterolateral aspect of the right breast with no sonographic correlate. EXAM: DIGITAL DIAGNOSTIC UNILATERAL RIGHT MAMMOGRAM WITH TOMOSYNTHESIS AND CAD TECHNIQUE: Right digital diagnostic mammography and breast tomosynthesis  was performed. The images were evaluated with computer-aided detection. COMPARISON:  Previous exam(s). ACR Breast Density Category c: The breast tissue is heterogeneously dense, which may  obscure small masses. FINDINGS: Asymmetry in the posterolateral aspect of the right breast only seen on the cc view is stable. No suspicious mass or malignant type microcalcifications identified. IMPRESSION: Stable probable benign asymmetry in the right breast. RECOMMENDATION: Bilateral diagnostic mammogram in July of 2023 is recommended. I have discussed the findings and recommendations with the patient. If applicable, a reminder letter will be sent to the patient regarding the next appointment. BI-RADS CATEGORY  3: Probably benign. Electronically Signed   By: Dina  Arceo M.D.   On: 03/07/2021 10:42  MS DIGITAL DIAG TOMO BILAT Result Date: 09/01/2020 CLINICAL DATA:  53 year old female who originally presented to a physician in June for evaluation of bilateral tender palpable lumps. The doctor at that time noted a tender mass at 9 o'clock measuring 3 x 1 cm and another mass had 2 o'clock involving much of the upper-outer quadrant of the left breast which was firm, hot and painful to the touch. The patient was referred to [facility], and now describes a palpable left breast mass at 9 o'clock, 2 cm from the areola. EXAM: DIGITAL DIAGNOSTIC BILATERAL MAMMOGRAM WITH TOMOSYNTHESIS AND CAD; ULTRASOUND LEFT BREAST LIMITED; ULTRASOUND RIGHT BREAST LIMITED TECHNIQUE: Bilateral digital diagnostic mammography and breast tomosynthesis was performed. The images were evaluated with computer-aided detection.; Targeted ultrasound examination of the left breast was performed; Targeted ultrasound examination of the right breast was performed COMPARISON:  None. ACR Breast Density Category c: The breast tissue is heterogeneously dense, which may obscure small masses. FINDINGS: There is an asymmetry in the lateral right breast seen only on the CC view  which spans approximately 2.8 cm. A BB has been placed on the lower inner quadrant of the left breast at the palpable site of concern. No suspicious findings are identified deep to this palpable marker on the spot compression tomosynthesis images. No suspicious calcifications, masses or areas of distortion are seen in the bilateral breasts. Physical exam of the lateral right breast, lateral left breast and lower inner left breast demonstrates no discrete suspicious palpable masses. Ultrasound targeted to the lateral aspect of the right breast demonstrates normal dense fibroglandular tissue. No suspicious masses or areas of shadowing are identified. Ultrasound targeted to the palpable area in the upper-outer quadrant of the left breast demonstrates normal fibroglandular tissue. No suspicious masses or areas of shadowing are identified. Ultrasound targeted to the patient's identified palpable site in the lower-inner quadrant of the left breast demonstrates normal fibroglandular tissue. No suspicious masses or areas of shadowing are identified. IMPRESSION: 1. There is a likely benign asymmetry in the posterolateral right breast on the CC view. 2. There are no suspicious mammographic or targeted sonographic abnormalities to correspond with the palpable areas of concern in the bilateral breasts. 3.  No mammographic evidence of malignancy in the bilateral breasts. RECOMMENDATION: Six-month follow-up diagnostic right breast mammogram. I have discussed the findings and recommendations with the patient. If applicable, a reminder letter will be sent to the patient regarding the next appointment. BI-RADS CATEGORY  3: Probably benign. Electronically Signed   By: Rosaline Collet M.D.   On: 09/01/2020 17:11   Pelvic/Bimanual Pap is not indicated today per BCCCP guidelines.   Smoking History: Patient has never smoked.   Patient Navigation: Patient education provided. Access to services provided for patient through [employer]  program. Spanish interpreter Madison James from [facility] provided.   Colorectal Cancer Screening: Per patient has never had colonoscopy completed. FIT Test given to patient to complete. No complaints today.  Breast and Cervical Cancer Risk Assessment: Patient does not have family history of breast cancer, known genetic mutations, or radiation treatment to the chest before age 73. Patient does not have history of cervical dysplasia, immunocompromised, or DES exposure in-utero.  Risk Scores as of Encounter on 11/27/2023     Madison James           5-year 0.48%   Lifetime 3.91%            Last calculated by Madison Tempie SQUIBB, LPN on 89/03/7972 at 10:54 AM        A: BCCCP exam without pap smear No complaints.  P: Referred patient to the [facility] for a screening mammogram. Appointment scheduled Tuesday, November 27, 2023 at 1120.  Driscilla Wanda SQUIBB, RN 11/27/2023 10:56 AM

## 2023-12-01 LAB — FECAL OCCULT BLOOD, IMMUNOCHEMICAL: Fecal Occult Bld: NEGATIVE

## 2023-12-03 ENCOUNTER — Ambulatory Visit: Payer: Self-pay
